# Patient Record
Sex: Male | Born: 1975 | Race: White | Hispanic: No | Marital: Married | Smoking: Never smoker
Health system: Southern US, Community
[De-identification: ages and names within clinical notes are randomized; demographics above are authoritative.]

---

## 1998-08-25 ENCOUNTER — Emergency Department (HOSPITAL_COMMUNITY): Admission: EM | Admit: 1998-08-25 | Discharge: 1998-08-25 | Payer: Self-pay | Admitting: Emergency Medicine

## 2010-09-10 ENCOUNTER — Emergency Department (HOSPITAL_BASED_OUTPATIENT_CLINIC_OR_DEPARTMENT_OTHER)
Admission: EM | Admit: 2010-09-10 | Discharge: 2010-09-11 | Disposition: A | Discharge status: Home / Self Care | Payer: BC Managed Care – PPO | Attending: Emergency Medicine | Admitting: Emergency Medicine

## 2010-09-10 ENCOUNTER — Emergency Department (INDEPENDENT_AMBULATORY_CARE_PROVIDER_SITE_OTHER): Payer: BC Managed Care – PPO

## 2010-09-10 DIAGNOSIS — N201 Calculus of ureter: Secondary | ICD-10-CM

## 2010-09-10 DIAGNOSIS — F17200 Nicotine dependence, unspecified, uncomplicated: Secondary | ICD-10-CM | POA: Insufficient documentation

## 2010-09-10 DIAGNOSIS — N200 Calculus of kidney: Secondary | ICD-10-CM | POA: Insufficient documentation

## 2010-09-10 DIAGNOSIS — R109 Unspecified abdominal pain: Secondary | ICD-10-CM | POA: Insufficient documentation

## 2010-09-10 LAB — URINALYSIS, ROUTINE W REFLEX MICROSCOPIC
Leukocytes, UA: NEGATIVE
Protein, ur: NEGATIVE mg/dL
Specific Gravity, Urine: 1.026 (ref 1.005–1.030)
Urine Glucose, Fasting: NEGATIVE mg/dL
pH: 6.5 (ref 5.0–8.0)

## 2010-09-10 LAB — URINE MICROSCOPIC-ADD ON

## 2011-11-09 IMAGING — CT CT ABD-PELV W/O CM
2 of 7 series · 16 of 46 positions shown, 18 images · non-contrast
Comparison: None.

***ADDENDUM*** CREATED: 09/11/2010 [DATE]

Upon further review, there is a small 2 mm stone within the distal
left ureter approximately 2 cm from the vesicoureteral junction.
Additional findings were discussed with Dr. Fareeha on 09/11/2010 at
1311 hours
***END ADDENDUM*** SIGNED BY: Arquimides Hennings, M.D.
CLINICAL DATA: 34-year-old male with left flank pain the
CT ABDOMEN AND PELVIS WITHOUT CONTRAST
TECHNIQUE: Multidetector CT imaging of the abdomen and pelvis was
performed following the standard protocol without intravenous
contrast.

[Series 2: renal stone < 200 lbs 5.0 b31f · axial · 0.71mm/px · z∈[-542,-122]mm · 13 of 94 slices shown, 15 images]
[im 5/94  soft-tissue]
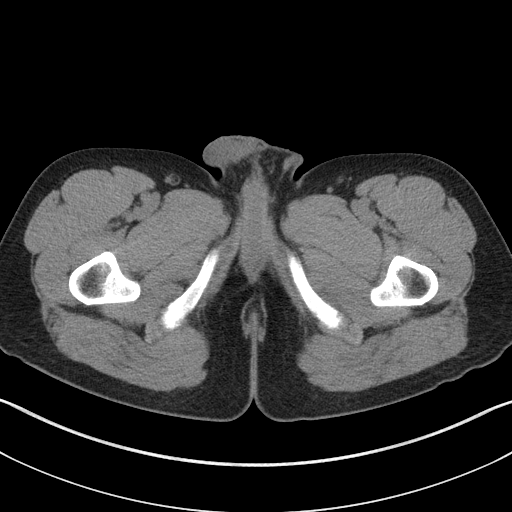
[im 5/94  bone]
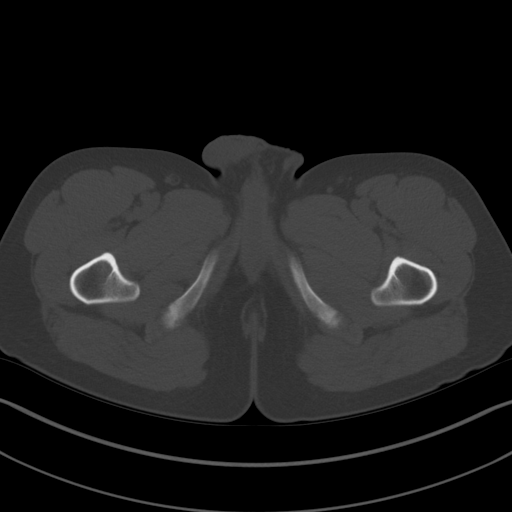
[im 13/94  soft-tissue]
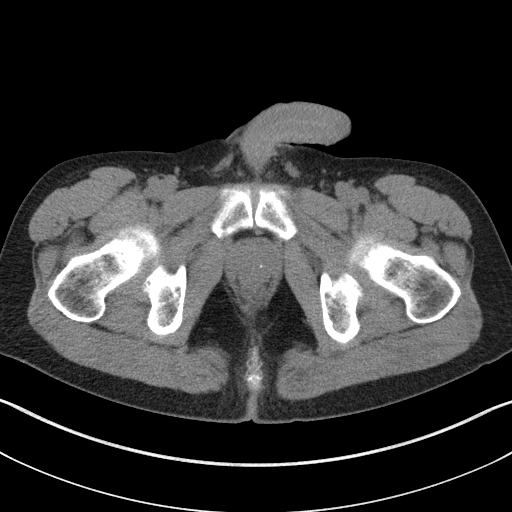
[im 22/94  soft-tissue]
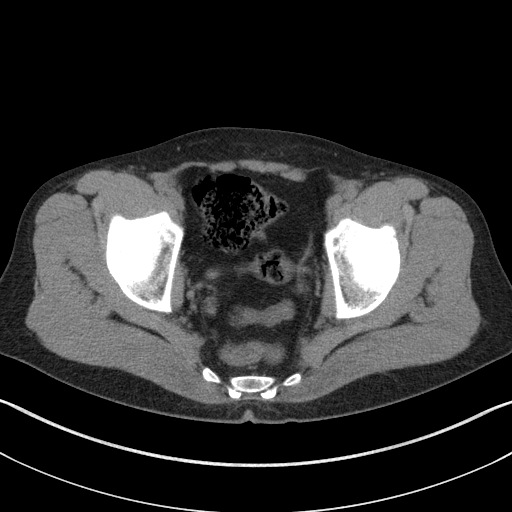
[im 26/94  soft-tissue]
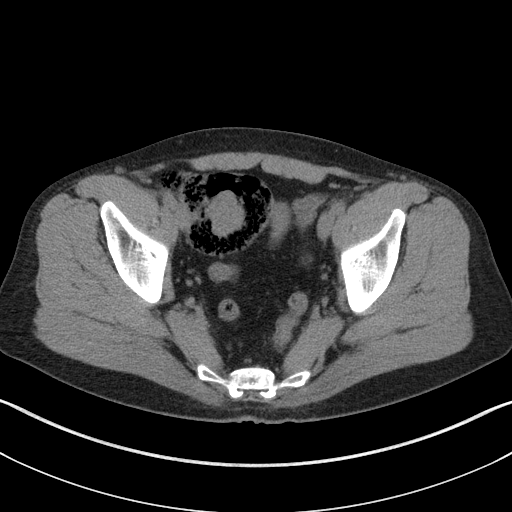
[im 34/94  soft-tissue]
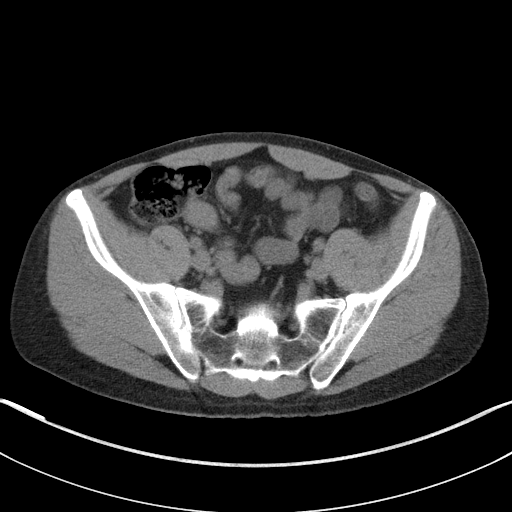
[im 39/94  soft-tissue]
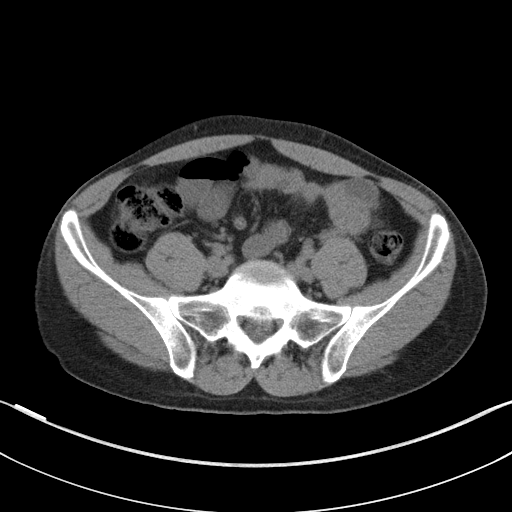
[im 47/94  soft-tissue]
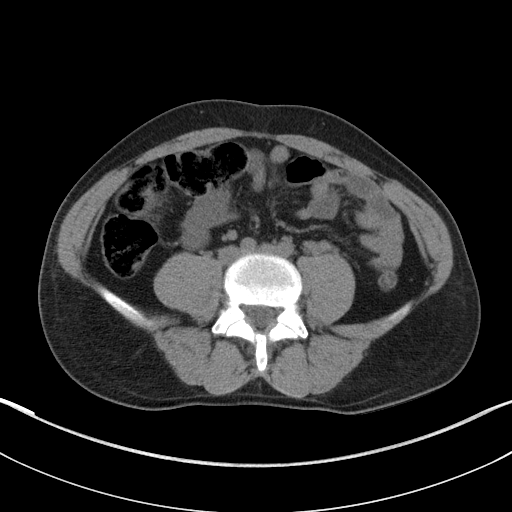
[im 55/94  soft-tissue]
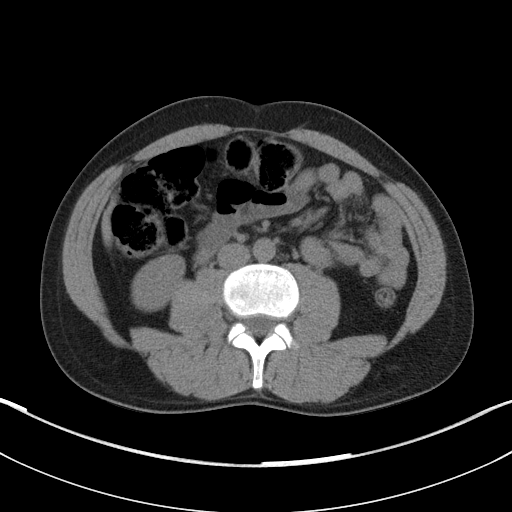
[im 60/94  soft-tissue]
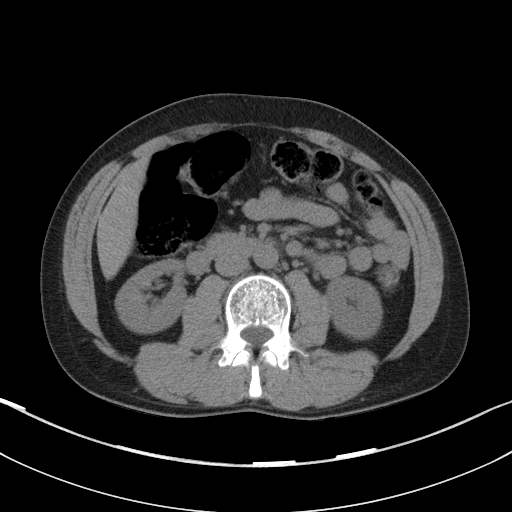
[im 60/94  bone]
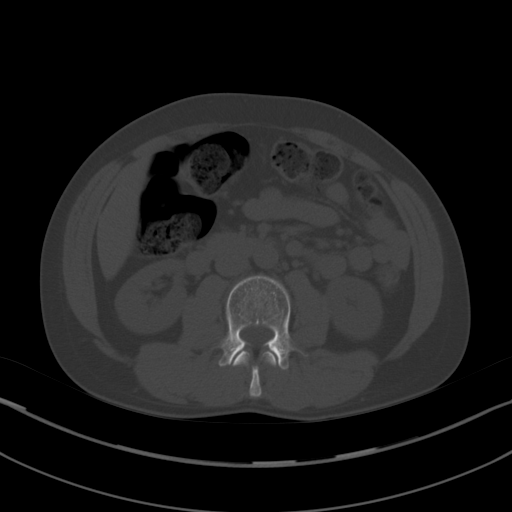
[im 68/94  soft-tissue]
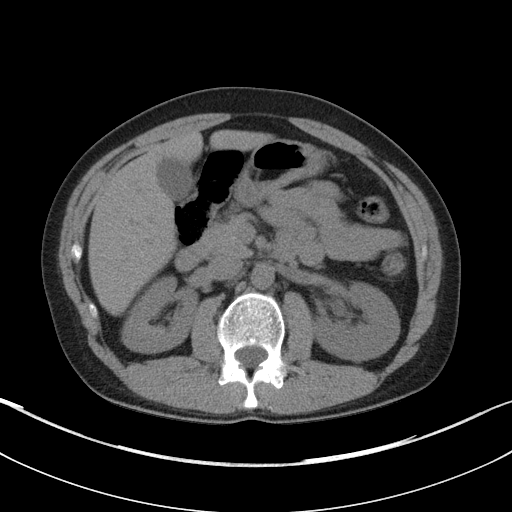
[im 72/94  soft-tissue]
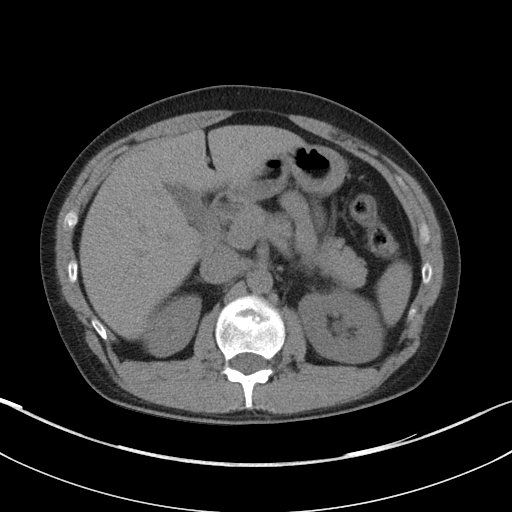
[im 81/94  soft-tissue]
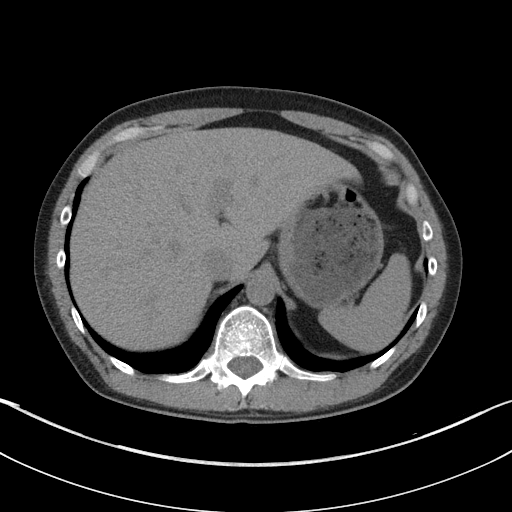
[im 89/94  soft-tissue]
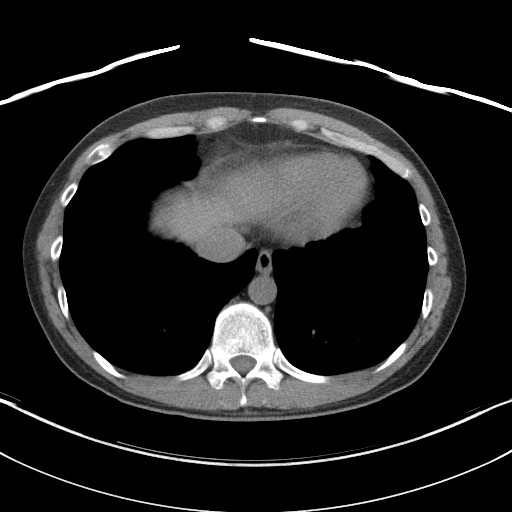

[Series 5: renal stone 3.0 coronal · coronal · 0.75mm/px · 3 of 75 slices shown]
[im 19/75  soft-tissue]
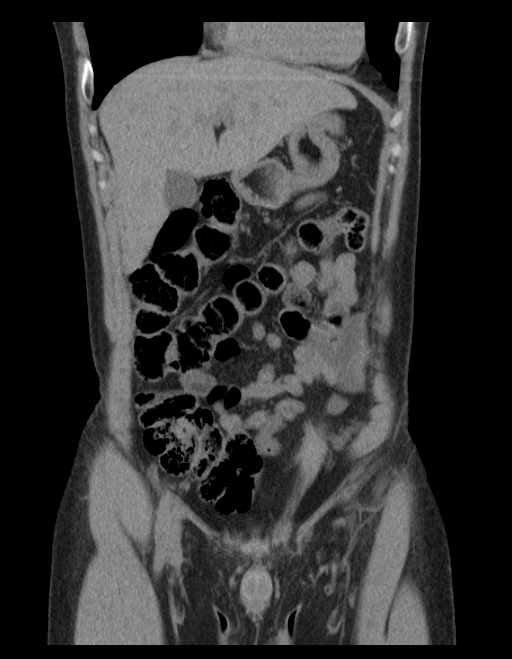
[im 38/75  soft-tissue]
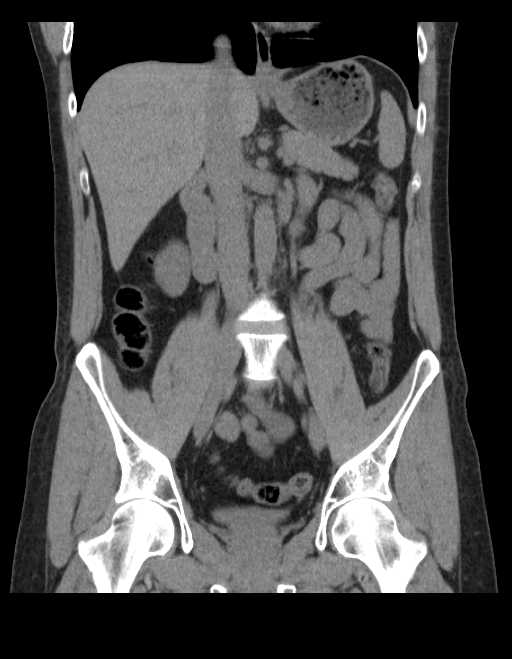
[im 56/75  soft-tissue]
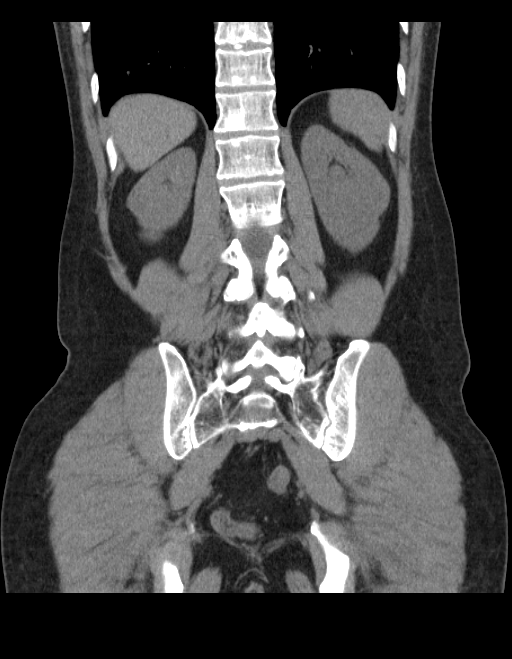

[16 of 46 positions shown; findings below may reference images not displayed]

FINDINGS: The lung bases are clear.  No pericardial fluid.

Non-IV contrast images demonstrate no focal hepatic lesion.  The
gallbladder, pancreas, spleen, adrenal glands are normal.  No
evidence of nephrolithiasis.  No evidence of ureterolithiasis.
There is a very mild stranding surrounding the left ureter which is
slightly dilated compared to the right.  Bladder is collapsed.  No
evidence of bladder stones.

The stomach, small bowel, appendix, and cecum are normal.  The
colon and rectosigmoid colon are normal.

Abdominal aorta is normal caliber.  No evidence of retroperitoneal
lymphadenopathy.

No evidence distal ureteral stones or bladder stones.  The prostate
gland is normal.  No pelvic lymphadenopathy. Review of  bone
windows demonstrates no aggressive osseous lesions.  There are
Schmorl's  noted in the upper lumbar spine and lower thoracic
spine.
IMPRESSION: 1.  No evidence of nephrolithiasis or ureterolithiasis.
2.  Mild periureteral stranding on the left could represent the
sequelae of a passed stone.

## 2014-09-13 DIAGNOSIS — L309 Dermatitis, unspecified: Secondary | ICD-10-CM | POA: Insufficient documentation

## 2014-09-13 DIAGNOSIS — F32A Depression, unspecified: Secondary | ICD-10-CM | POA: Insufficient documentation

## 2014-09-13 DIAGNOSIS — F339 Major depressive disorder, recurrent, unspecified: Secondary | ICD-10-CM | POA: Insufficient documentation

## 2016-07-27 DIAGNOSIS — Z2821 Immunization not carried out because of patient refusal: Secondary | ICD-10-CM | POA: Insufficient documentation

## 2021-05-26 DIAGNOSIS — F411 Generalized anxiety disorder: Secondary | ICD-10-CM | POA: Insufficient documentation

## 2021-05-26 DIAGNOSIS — R7301 Impaired fasting glucose: Secondary | ICD-10-CM | POA: Insufficient documentation

## 2021-05-26 DIAGNOSIS — E782 Mixed hyperlipidemia: Secondary | ICD-10-CM | POA: Insufficient documentation

## 2022-01-02 ENCOUNTER — Ambulatory Visit (INDEPENDENT_AMBULATORY_CARE_PROVIDER_SITE_OTHER): Payer: BC Managed Care – PPO

## 2022-01-02 ENCOUNTER — Encounter: Payer: Self-pay | Admitting: Podiatry

## 2022-01-02 ENCOUNTER — Ambulatory Visit (INDEPENDENT_AMBULATORY_CARE_PROVIDER_SITE_OTHER): Payer: BC Managed Care – PPO | Admitting: Podiatry

## 2022-01-02 DIAGNOSIS — M7732 Calcaneal spur, left foot: Secondary | ICD-10-CM

## 2022-01-02 DIAGNOSIS — M722 Plantar fascial fibromatosis: Secondary | ICD-10-CM

## 2022-01-02 MED ORDER — MELOXICAM 15 MG PO TABS: 15.0000 mg | ORAL_TABLET | Freq: Every day | ORAL | 3 refills | Status: AC

## 2022-01-02 MED ORDER — TRIAMCINOLONE ACETONIDE 40 MG/ML IJ SUSP
20.0000 mg | Freq: Once | INTRAMUSCULAR | Status: AC
  Administered 2022-01-02: 20 mg

## 2022-01-02 MED ORDER — METHYLPREDNISOLONE 4 MG PO TBPK: ORAL_TABLET | ORAL | 0 refills | Status: AC

## 2022-01-02 NOTE — Patient Instructions (Signed)

## 2022-01-02 NOTE — Progress Notes (Signed)
  Subjective:  Patient ID: Russell Palmer, male    DOB: Jul 30, 1976,  MRN: [id number] HPI Chief Complaint  Patient presents with   Foot Pain    Plantar heel left - aching x 1 month, history of PF 15 years ago-injections helped, AM pain, tried orthopedic socks and shoes-no help   New Patient (Initial Visit)    46 y.o. male presents with the above complaint.   ROS: Denies fever chills nausea vomiting muscle aches pains calf pain back pain chest pain shortness of breath.  No past medical history on file.   Current Outpatient Medications:    meloxicam (MOBIC) 15 MG tablet, Take 1 tablet (15 mg total) by mouth daily., Disp: 30 tablet, Rfl: 3   methylPREDNISolone (MEDROL DOSEPAK) 4 MG TBPK tablet, 6 day dose pack - take as directed, Disp: 21 tablet, Rfl: 0   betamethasone valerate ointment (VALISONE) 0.1 %, Apply topically 2 (two) times daily as needed., Disp: , Rfl:    buPROPion (WELLBUTRIN XL) 150 MG 24 hr tablet, Take 150 mg by mouth every morning., Disp: , Rfl:    citalopram (CELEXA) 40 MG tablet, Take 60 mg by mouth daily., Disp: , Rfl:    hydrOXYzine (VISTARIL) 25 MG capsule, Take 25 mg by mouth 2 (two) times daily., Disp: , Rfl:    rosuvastatin (CRESTOR) 10 MG tablet, Take 10 mg by mouth daily., Disp: , Rfl:   Allergies  Allergen Reactions   Amoxicillin Rash   Review of Systems Objective:  There were no vitals filed for this visit.  General: Well developed, nourished, in no acute distress, alert and oriented x3   Dermatological: Skin is warm, dry and supple bilateral. Nails x 10 are well maintained; remaining integument appears unremarkable at this time. There are no open sores, no preulcerative lesions, no rash or signs of infection present.  Vascular: Dorsalis Pedis artery and Posterior Tibial artery pedal pulses are 2/4 bilateral with immedate capillary fill time. Pedal hair growth present. No varicosities and no lower extremity edema present bilateral.   Neruologic: Grossly  intact via light touch bilateral. Vibratory intact via tuning fork bilateral. Protective threshold with Semmes Wienstein monofilament intact to all pedal sites bilateral. Patellar and Achilles deep tendon reflexes 2+ bilateral. No Babinski or clonus noted bilateral.   Musculoskeletal: No gross boney pedal deformities bilateral. No pain, crepitus, or limitation noted with foot and ankle range of motion bilateral. Muscular strength 5/5 in all groups tested bilateral.  Pain on palpation medial calcaneal tubercle  Gait: Unassisted, Nonantalgic.    Radiographs:  Radiographs taken today demonstrate an osseously mature individual with a plantar distally oriented calcaneal heel spur and a soft tissue increase in density plantar fascial Caney insertion site.  Assessment & Plan:   Assessment: Planter fasciitis  Plan: Injected left heel today 20 mg Kenalog 5 mg Marcaine.  Started him on methylprednisolone to be followed by meloxicam.  Follow-up with him in 1 month     Tammala Weider T. [place], [place]

## 2022-02-13 ENCOUNTER — Ambulatory Visit: Payer: BC Managed Care – PPO | Admitting: Podiatry

## 2022-06-04 ENCOUNTER — Ambulatory Visit (INDEPENDENT_AMBULATORY_CARE_PROVIDER_SITE_OTHER): Payer: BC Managed Care – PPO | Admitting: Podiatry

## 2022-06-04 DIAGNOSIS — M722 Plantar fascial fibromatosis: Secondary | ICD-10-CM

## 2022-06-04 NOTE — Patient Instructions (Signed)

## 2022-06-05 NOTE — Progress Notes (Signed)
  Subjective:  Patient ID: Russell Palmer, male    DOB: 09-20-1975,  MRN: [id number]  Chief Complaint  Patient presents with   Plantar Fasciitis    Left foot flare up, Dr Milinda Pointer patient    46 y.o. male presents with the above complaint. History confirmed with patient.  Last injection given quite a bit of relief  Objective:  Physical Exam: warm, good capillary refill, no trophic changes or ulcerative lesions, normal DP and PT pulses, normal sensory exam, and pain on palpation of plantar medial heel at insertion of plantar fascia.  Assessment:   1. Plantar fasciitis of left foot      Plan:  Patient was evaluated and treated and all questions answered.  After sterile prep with povidone-iodine solution and alcohol, the left heel was injected with 0.5cc 2% xylocaine plain, 0.5cc 0.5% marcaine plain, 5mg  triamcinolone acetonide, and 2mg  dexamethasone was injected along the medial plantar fascia at the insertion on the plantar calcaneus. The patient tolerated the procedure well without complication.  I also recommend home physical therapy which I gave to him exercises to work on twice daily   Return if symptoms worsen or fail to improve.

## 2022-12-12 ENCOUNTER — Encounter: Payer: Self-pay | Admitting: Podiatry

## 2022-12-12 ENCOUNTER — Ambulatory Visit (INDEPENDENT_AMBULATORY_CARE_PROVIDER_SITE_OTHER): Payer: BC Managed Care – PPO | Admitting: Podiatry

## 2022-12-12 DIAGNOSIS — M722 Plantar fascial fibromatosis: Secondary | ICD-10-CM

## 2022-12-12 MED ORDER — DEXAMETHASONE SODIUM PHOSPHATE 120 MG/30ML IJ SOLN: 4.0000 mg | Freq: Once | INTRAMUSCULAR | Status: AC

## 2022-12-12 NOTE — Progress Notes (Signed)
  Subjective:  Patient ID: Russell Palmer, male    DOB: 1975-10-16,  MRN: [id number]  Chief Complaint  Patient presents with   Injections    Patient present to have injections in bilateral heels of feet Bilateral heels    47 y.o. male presents with the above complaint. History confirmed with patient.  Last injection given quite a bit of relief. Relates pain has now started in the right heel as well.   Objective:  Physical Exam: warm, good capillary refill, no trophic changes or ulcerative lesions, normal DP and PT pulses, normal sensory exam, and pain on palpation of plantar medial heel at insertion of plantar fascia bilateral  Assessment:   1. Plantar fasciitis of left foot   2. Plantar fasciitis of right foot       Plan:  Patient was evaluated and treated and all questions answered. Discussed plantar fasciitis with patient.  X-rays reviewed and discussed with patient. No acute fractures or dislocations noted. Mild spurring noted at inferior calcaneus.  Discussed treatment options including, ice, NSAIDS, supportive shoes, bracing, and stretching. Stretching exercises provided to be done on a daily basis.   Patient requesting injection today. Procedure note below.    Procedure:  Discussed etiology, pathology, conservative vs. surgical therapies. At this time a plantar fascial injection was recommended.  The patient agreed and a sterile skin prep was applied.  An injection consisting of  dexamethasone and marcaine mixture was infiltrated at the point of maximal tenderness on the bilateral Heel.  Bandaid applied. The patient tolerated this well and was given instructions for aftercare.       Return if symptoms worsen or fail to improve.
